# Patient Record
Sex: Female | Born: 1960 | Race: White | Hispanic: No | Marital: Married | Smoking: Never smoker
Health system: Southern US, Community
[De-identification: ages and names within clinical notes are randomized; demographics above are authoritative.]

## PROBLEM LIST (undated history)

## (undated) DIAGNOSIS — E0590 Thyrotoxicosis, unspecified without thyrotoxic crisis or storm: Secondary | ICD-10-CM

## (undated) DIAGNOSIS — M779 Enthesopathy, unspecified: Secondary | ICD-10-CM

## (undated) DIAGNOSIS — I10 Essential (primary) hypertension: Secondary | ICD-10-CM

## (undated) HISTORY — DX: Essential (primary) hypertension: I10

## (undated) HISTORY — DX: Thyrotoxicosis, unspecified without thyrotoxic crisis or storm: E05.90

## (undated) HISTORY — DX: Enthesopathy, unspecified: M77.9

---

## 1998-11-23 DIAGNOSIS — M779 Enthesopathy, unspecified: Secondary | ICD-10-CM

## 1998-11-23 HISTORY — DX: Enthesopathy, unspecified: M77.9

## 2010-10-28 LAB — HM COLONOSCOPY

## 2016-06-17 ENCOUNTER — Ambulatory Visit (INDEPENDENT_AMBULATORY_CARE_PROVIDER_SITE_OTHER): Payer: BLUE CROSS/BLUE SHIELD | Admitting: Physician Assistant

## 2016-06-17 ENCOUNTER — Encounter: Payer: Self-pay | Admitting: Physician Assistant

## 2016-06-17 VITALS — BP 120/92 | HR 95 | Ht 70.0 in | Wt 227.0 lb

## 2016-06-17 DIAGNOSIS — E0590 Thyrotoxicosis, unspecified without thyrotoxic crisis or storm: Secondary | ICD-10-CM | POA: Diagnosis not present

## 2016-06-17 DIAGNOSIS — E669 Obesity, unspecified: Secondary | ICD-10-CM

## 2016-06-17 DIAGNOSIS — I10 Essential (primary) hypertension: Secondary | ICD-10-CM | POA: Diagnosis not present

## 2016-06-17 NOTE — Progress Notes (Addendum)
   Subjective:    Patient ID: Kimberly Middleton, female    DOB: [birth year], 55 y.o.   MRN: [id number]  HPI Pt is a 55 year old female that presents to clinic to establish care. She has a few concerns. She has a history of hyperthyroidism and wants to discuss continued management. She also is concerned about weight gain following menopause. She states that she wants to work on losing weight and has started to make some lifestyle modifications. She reports not being interested in medical therapy for weight loss at this time.   She feels well controlled with lisinopril for hypertension. No other complaints.  .. Active Ambulatory Problems    Diagnosis Date Noted  . Essential hypertension, benign 06/17/2016  . Hyperthyroidism 06/17/2016  . Obese 06/17/2016   Resolved Ambulatory Problems    Diagnosis Date Noted  . No Resolved Ambulatory Problems   No Additional Past Medical History   .Marland Kitchen Family History  Problem Relation Age of Onset  . Heart attack Father   . Diabetes Mellitus I Brother    . Social History   Social History  . Marital status: Married    Spouse name: N/A  . Number of children: N/A  . Years of education: N/A   Occupational History  . Not on file.   Social History Main Topics  . Smoking status: Never Smoker  . Smokeless tobacco: Never Used  . Alcohol use Yes  . Drug use: No  . Sexual activity: Yes   Other Topics Concern  . Not on file   Social History Narrative  . No narrative on file       Review of Systems  All other systems reviewed and are negative.      Objective:   Physical Exam  Constitutional: She is oriented to person, place, and time. She appears well-developed and well-nourished.  HENT:  Head: Normocephalic and atraumatic.  Neck: Neck supple. No thyromegaly present.  Cardiovascular: Normal rate, regular rhythm and normal heart sounds.   Pulmonary/Chest: Effort normal and breath sounds normal.  Neurological: She is alert and  oriented to person, place, and time.  Psychiatric: She has a normal mood and affect. Her behavior is normal.       Assessment & Plan:  Hyperthyroidism- Followed by endocrinology. Discussed with pt to Consider thyroid ablation to better manage thyroid disease. I am willing to take over management but I think long term she needs ablation. Follow up in 3 months.   Obesity- Discussed healthy diet and exercise. Encourage drinking enough water, increasing activity and realistic expectations. Discussed counting calories to be about 1500 calories a day. Discussed weight loss medicine. Follow up as needed.   Hypertension- Continue lisinopril. Recheck BP improved.   Follow up in 3 months for fasting labs

## 2016-07-01 ENCOUNTER — Encounter: Payer: Self-pay | Admitting: Physician Assistant

## 2016-07-01 DIAGNOSIS — N052 Unspecified nephritic syndrome with diffuse membranous glomerulonephritis: Secondary | ICD-10-CM | POA: Insufficient documentation

## 2016-07-01 DIAGNOSIS — B029 Zoster without complications: Secondary | ICD-10-CM | POA: Insufficient documentation

## 2016-09-18 ENCOUNTER — Ambulatory Visit: Payer: BLUE CROSS/BLUE SHIELD | Admitting: Physician Assistant

## 2017-01-29 ENCOUNTER — Ambulatory Visit (INDEPENDENT_AMBULATORY_CARE_PROVIDER_SITE_OTHER): Payer: BLUE CROSS/BLUE SHIELD | Admitting: Physician Assistant

## 2017-01-29 ENCOUNTER — Encounter: Payer: Self-pay | Admitting: Physician Assistant

## 2017-01-29 VITALS — BP 150/76 | HR 116 | Temp 100.3°F | Ht 70.0 in | Wt 234.0 lb

## 2017-01-29 DIAGNOSIS — R059 Cough, unspecified: Secondary | ICD-10-CM

## 2017-01-29 DIAGNOSIS — R05 Cough: Secondary | ICD-10-CM

## 2017-01-29 DIAGNOSIS — R509 Fever, unspecified: Secondary | ICD-10-CM | POA: Diagnosis not present

## 2017-01-29 DIAGNOSIS — J101 Influenza due to other identified influenza virus with other respiratory manifestations: Secondary | ICD-10-CM | POA: Diagnosis not present

## 2017-01-29 LAB — POCT INFLUENZA A/B
INFLUENZA A, POC: POSITIVE — AB
INFLUENZA B, POC: NEGATIVE

## 2017-01-29 MED ORDER — OSELTAMIVIR PHOSPHATE 75 MG PO CAPS: 75.0000 mg | ORAL_CAPSULE | Freq: Two times a day (BID) | ORAL | 0 refills | Status: DC

## 2017-01-29 MED ORDER — OSELTAMIVIR PHOSPHATE 75 MG PO CAPS
75.0000 mg | ORAL_CAPSULE | Freq: Two times a day (BID) | ORAL | 0 refills | Status: DC
Start: 2017-01-29 — End: 2017-01-29

## 2017-01-29 NOTE — Progress Notes (Addendum)
   Subjective:    Patient ID: Kimberly Middleton, female    DOB: [birth year], 58 y.o.   MRN: [id number]  HPI Pt is a 56 yo female who presents to the clinic with 1 week of cold like symptoms that last night she spiked a fever of 101 and symptoms worsened. She feels weak and has a productive cough. She reports fever and chills. Her boss at work was diagnosed with the flu. No ear pain, ST, sinus pressure. Pt did not have flu shot. She is taking OTC mucinex and robatussin DM. It is helping some.    Review of Systems  All other systems reviewed and are negative.      Objective:   Physical Exam  Constitutional: She is oriented to person, place, and time. She appears well-developed and well-nourished.  HENT:  Head: Normocephalic and atraumatic.  Right Ear: External ear normal.  Left Ear: External ear normal.  Nose: Nose normal.  Mouth/Throat: Oropharynx is clear and moist. No oropharyngeal exudate.  TM's erythematous bilaterally. Good light reflex.  Negative for any sinus tenderness to palpation.   Eyes: Conjunctivae are normal. Right eye exhibits no discharge. Left eye exhibits no discharge.  Neck: Normal range of motion. Neck supple.  Cardiovascular: Normal rate, regular rhythm and normal heart sounds.   Pulmonary/Chest: Effort normal and breath sounds normal. She has no wheezes.  Lymphadenopathy:    She has cervical adenopathy.  Neurological: She is alert and oriented to person, place, and time.  Psychiatric: She has a normal mood and affect. Her behavior is normal.          Assessment & Plan:  Marland Kitchen.Marland Kitchen.Misty StanleyLisa was seen today for cough, headache and fever.  Diagnoses and all orders for this visit:  Influenza A -     oseltamivir (TAMIFLU) 75 MG capsule; Take 1 capsule (75 mg total) by mouth 2 (two) times daily. For 5 days.  Cough -     POCT Influenza A/B  Fever, unspecified fever cause -     POCT Influenza A/B  Other orders -     Discontinue: oseltamivir (TAMIFLU) 75 MG capsule;  Take 1 capsule (75 mg total) by mouth 2 (two) times daily. For 5 days.   Sent tamiflu. Discussed symptomatic care. Encouraged rest, hydration. Continue using cough syrup OTC. Written out of work for today. Go back on Monday.

## 2017-01-29 NOTE — Patient Instructions (Signed)

## 2017-06-17 ENCOUNTER — Encounter (INDEPENDENT_AMBULATORY_CARE_PROVIDER_SITE_OTHER): Payer: Self-pay | Admitting: Family Medicine

## 2017-10-18 LAB — HM MAMMOGRAPHY

## 2017-11-13 LAB — HM PAP SMEAR: HM Pap smear: NEGATIVE

## 2017-12-03 ENCOUNTER — Encounter: Payer: Self-pay | Admitting: Physician Assistant

## 2018-01-12 ENCOUNTER — Encounter: Payer: Self-pay | Admitting: Physician Assistant

## 2018-03-22 ENCOUNTER — Ambulatory Visit (INDEPENDENT_AMBULATORY_CARE_PROVIDER_SITE_OTHER): Payer: BLUE CROSS/BLUE SHIELD | Admitting: Physician Assistant

## 2018-03-22 ENCOUNTER — Encounter: Payer: Self-pay | Admitting: Physician Assistant

## 2018-03-22 VITALS — BP 122/81 | HR 84 | Ht 70.0 in | Wt 222.0 lb

## 2018-03-22 DIAGNOSIS — H6983 Other specified disorders of Eustachian tube, bilateral: Secondary | ICD-10-CM

## 2018-03-22 MED ORDER — METHYLPREDNISOLONE 4 MG PO TBPK: ORAL_TABLET | ORAL | 0 refills | Status: DC

## 2018-03-22 NOTE — Patient Instructions (Signed)
Eustachian Tube Dysfunction The eustachian tube connects the middle ear to the back of the nose. It regulates air pressure in the middle ear by allowing air to move between the ear and nose. It also helps to drain fluid from the middle ear space. When the eustachian tube does not function properly, air pressure, fluid, or both can build up in the middle ear. Eustachian tube dysfunction can affect one or both ears. What are the causes? This condition happens when the eustachian tube becomes blocked or cannot open normally. This may result from:  Ear infections.  Colds and other upper respiratory infections.  Allergies.  Irritation, such as from cigarette smoke or acid from the stomach coming up into the esophagus (gastroesophageal reflux).  Sudden changes in air pressure, such as from descending in an airplane.  Abnormal growths in the nose or throat, such as nasal polyps, tumors, or enlarged tissue at the back of the throat (adenoids).  What increases the risk? This condition may be more likely to develop in people who smoke and people who are overweight. Eustachian tube dysfunction may also be more likely to develop in children, especially children who have:  Certain birth defects of the mouth, such as cleft palate.  Large tonsils and adenoids.  What are the signs or symptoms? Symptoms of this condition may include:  A feeling of fullness in the ear.  Ear pain.  Clicking or popping noises in the ear.  Ringing in the ear.  Hearing loss.  Loss of balance.  Symptoms may get worse when the air pressure around you changes, such as when you travel to an area of high elevation or fly on an airplane. How is this diagnosed? This condition may be diagnosed based on:  Your symptoms.  A physical exam of your ear, nose, and throat.  Tests, such as those that measure: ? The movement of your eardrum (tympanogram). ? Your hearing (audiometry).  How is this treated? Treatment  depends on the cause and severity of your condition. If your symptoms are mild, you may be able to relieve your symptoms by moving air into ("popping") your ears. If you have symptoms of fluid in your ears, treatment may include:  Decongestants.  Antihistamines.  Nasal sprays or ear drops that contain medicines that reduce swelling (steroids).  In some cases, you may need to have a procedure to drain the fluid in your eardrum (myringotomy). In this procedure, a small tube is placed in the eardrum to:  Drain the fluid.  Restore the air in the middle ear space.  Follow these instructions at home:  Take over-the-counter and prescription medicines only as told by your health care provider.  Use techniques to help pop your ears as recommended by your health care provider. These may include: ? Chewing gum. ? Yawning. ? Frequent, forceful swallowing. ? Closing your mouth, holding your nose closed, and gently blowing as if you are trying to blow air out of your nose.  Do not do any of the following until your health care provider approves: ? Travel to high altitudes. ? Fly in airplanes. ? Work in a pressurized cabin or room. ? Scuba dive.  Keep your ears dry. Dry your ears completely after showering or bathing.  Do not smoke.  Keep all follow-up visits as told by your health care provider. This is important. Contact a health care provider if:  Your symptoms do not go away after treatment.  Your symptoms come back after treatment.  You are   unable to pop your ears.  You have: ? A fever. ? Pain in your ear. ? Pain in your head or neck. ? Fluid draining from your ear.  Your hearing suddenly changes.  You become very dizzy.  You lose your balance. This information is not intended to replace advice given to you by your health care provider. Make sure you discuss any questions you have with your health care provider. Document Released: 12/06/2015 Document Revised: 04/16/2016  Document Reviewed: 11/28/2014 [employer]vier Interactive Patient Education  2018 [employer]  

## 2018-03-22 NOTE — Progress Notes (Signed)
   Subjective:    Patient ID: Kimberly Middleton, female    DOB: 1961-07-02, 57 y.o.   MRN: [id number]  HPI  Pt is a 57 yo female with HTN, hyperthyroidism who presents to the clinic with headache and bilateral ear pain intermittently for a few weeks. Her headache was really bad this weekend but has since improved. She denies any sinus pressure, cough, SOB, wheezing, ST. She denies any allergy symptoms with watery itchy eyes. Last night her ears were hurting really bad. No fever, chills, body aches.    Hyperthyroidism-managed by endocrinology.   .. Active Ambulatory Problems    Diagnosis Date Noted  . Essential hypertension, benign 06/17/2016  . Hyperthyroidism 06/17/2016  . Obese 06/17/2016  . 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 Herpes zoster 07/01/2016   Resolved Ambulatory Problems    Diagnosis Date Noted  . No Resolved Ambulatory Problems   Past Medical History:  Diagnosis Date  . Bone spur 2000  . Hypertension   . Hyperthyroidism      Review of Systems  All other systems reviewed and are negative.      Objective:   Physical Exam  Constitutional: She is oriented to person, place, and time. She appears well-developed and well-nourished.  HENT:  Head: Normocephalic and atraumatic.  Right Ear: External ear normal.  Left Ear: External ear normal.  TM"s slightly retracted. Good light reflex.  Negative for sinus tenderness.  Eyes: Pupils are equal, round, and reactive to light. Conjunctivae and EOM are normal.  Neck:  Right thyroid goiter.   Cardiovascular: Normal rate and regular rhythm.  Pulmonary/Chest: Effort normal and breath sounds normal.  Neurological: She is alert and oriented to person, place, and time.  Skin: No rash noted.  Psychiatric: She has a normal mood and affect. Her behavior is normal.          Assessment & Plan:  Marland KitchenMarland KitchenDiagnoses and all orders for this visit:  ETD (Eustachian tube dysfunction), bilateral -     methylPREDNISolone  (MEDROL DOSEPAK) 4 MG TBPK tablet; Take as directed by package insert.   Reassurance given that I see no signs of infection today. 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 Discussed chewing gum when doing this. Encouraged flonase. Medrol dose pak given today.

## 2018-08-09 ENCOUNTER — Ambulatory Visit (INDEPENDENT_AMBULATORY_CARE_PROVIDER_SITE_OTHER): Payer: BLUE CROSS/BLUE SHIELD

## 2018-08-09 ENCOUNTER — Encounter: Payer: Self-pay | Admitting: Physician Assistant

## 2018-08-09 ENCOUNTER — Ambulatory Visit (INDEPENDENT_AMBULATORY_CARE_PROVIDER_SITE_OTHER): Payer: BLUE CROSS/BLUE SHIELD | Admitting: Physician Assistant

## 2018-08-09 VITALS — BP 130/78 | HR 99 | Ht 70.0 in | Wt 222.0 lb

## 2018-08-09 DIAGNOSIS — M25552 Pain in left hip: Secondary | ICD-10-CM

## 2018-08-09 DIAGNOSIS — Z131 Encounter for screening for diabetes mellitus: Secondary | ICD-10-CM

## 2018-08-09 DIAGNOSIS — M1612 Unilateral primary osteoarthritis, left hip: Secondary | ICD-10-CM | POA: Diagnosis not present

## 2018-08-09 DIAGNOSIS — Z23 Encounter for immunization: Secondary | ICD-10-CM | POA: Diagnosis not present

## 2018-08-09 DIAGNOSIS — Z0000 Encounter for general adult medical examination without abnormal findings: Secondary | ICD-10-CM | POA: Diagnosis not present

## 2018-08-09 DIAGNOSIS — Z13220 Encounter for screening for lipoid disorders: Secondary | ICD-10-CM

## 2018-08-09 NOTE — Patient Instructions (Addendum)
Health Maintenance for Postmenopausal Women Menopause is a normal process in which your reproductive ability comes to an end. This process happens gradually over a span of months to years, usually between the ages of 22 and 9. Menopause is complete when you have missed 12 consecutive menstrual periods. It is important to talk with your health care provider about some of the most common conditions that affect postmenopausal women, such as heart disease, cancer, and bone loss (osteoporosis). Adopting a healthy lifestyle and getting preventive care can help to promote your health and wellness. Those actions can also lower your chances of developing some of these common conditions. What should I know about menopause? During menopause, you may experience a number of symptoms, such as:  Moderate-to-severe hot flashes.  Night sweats.  Decrease in sex drive.  Mood swings.  Headaches.  Tiredness.  Irritability.  Memory problems.  Insomnia.  Choosing to treat or not to treat menopausal changes is an individual decision that you make with your health care provider. What should I know about hormone replacement therapy and supplements? Hormone therapy products are effective for treating symptoms that are associated with menopause, such as hot flashes and night sweats. Hormone replacement carries certain risks, especially as you become older. If you are thinking about using estrogen or estrogen with progestin treatments, discuss the benefits and risks with your health care provider. What should I know about heart disease and stroke? Heart disease, heart attack, and stroke become more likely as you age. This may be due, in part, to the hormonal changes that your body experiences during menopause. These can affect how your body processes dietary fats, triglycerides, and cholesterol. Heart attack and stroke are both medical emergencies. There are many things that you can do to help prevent heart disease  and stroke:  Have your blood pressure checked at least every 1-2 years. High blood pressure causes heart disease and increases the risk of stroke.  If you are 53-22 years old, ask your health care provider if you should take aspirin to prevent a heart attack or a stroke.  Do not use any tobacco products, including cigarettes, chewing tobacco, or electronic cigarettes. If you need help quitting, ask your health care provider.  It is important to eat a healthy diet and maintain a healthy weight. ? Be sure to include plenty of vegetables, fruits, low-fat dairy products, and lean protein. ? Avoid eating foods that are high in solid fats, added sugars, or salt (sodium).  Get regular exercise. This is one of the most important things that you can do for your health. ? Try to exercise for at least 150 minutes each week. The type of exercise that you do should increase your heart rate and make you sweat. This is known as moderate-intensity exercise. ? Try to do strengthening exercises at least twice each week. Do these in addition to the moderate-intensity exercise.  Know your numbers.Ask your health care provider to check your cholesterol and your blood glucose. Continue to have your blood tested as directed by your health care provider.  What should I know about cancer screening? There are several types of cancer. Take the following steps to reduce your risk and to catch any cancer development as early as possible. Breast Cancer  Practice breast self-awareness. ? This means understanding how your breasts normally appear and feel. ? It also means doing regular breast self-exams. Let your health care provider know about any changes, no matter how small.  If you are 40  or older, have a clinician do a breast exam (clinical breast exam or CBE) every year. Depending on your age, family history, and medical history, it may be recommended that you also have a yearly breast X-ray (mammogram).  If you  have a family history of breast cancer, talk with your health care provider about genetic screening.  If you are at high risk for breast cancer, talk with your health care provider about having an MRI and a mammogram every year.  Breast cancer (BRCA) gene test is recommended for women who have family members with BRCA-related cancers. Results of the assessment will determine the need for genetic counseling and BRCA1 and for BRCA2 testing. BRCA-related cancers include these types: ? Breast. This occurs in males or females. ? Ovarian. ? Tubal. This may also be called fallopian tube cancer. ? Cancer of the abdominal or pelvic lining (peritoneal cancer). ? Prostate. ? Pancreatic.  Cervical, Uterine, and Ovarian Cancer Your health care provider may recommend that you be screened regularly for cancer of the pelvic organs. These include your ovaries, uterus, and vagina. This screening involves a pelvic exam, which includes checking for microscopic changes to the surface of your cervix (Pap test).  For women ages 21-65, health care providers may recommend a pelvic exam and a Pap test every three years. For women ages 79-65, they may recommend the Pap test and pelvic exam, combined with testing for human papilloma virus (HPV), every five years. Some types of HPV increase your risk of cervical cancer. Testing for HPV may also be done on women of any age who have unclear Pap test results.  Other health care providers may not recommend any screening for nonpregnant women who are considered low risk for pelvic cancer and have no symptoms. Ask your health care provider if a screening pelvic exam is right for you.  If you have had past treatment for cervical cancer or a condition that could lead to cancer, you need Pap tests and screening for cancer for at least 20 years after your treatment. If Pap tests have been discontinued for you, your risk factors (such as having a new sexual partner) need to be  reassessed to determine if you should start having screenings again. Some women have medical problems that increase the chance of getting cervical cancer. In these cases, your health care provider may recommend that you have screening and Pap tests more often.  If you have a family history of uterine cancer or ovarian cancer, talk with your health care provider about genetic screening.  If you have vaginal bleeding after reaching menopause, tell your health care provider.  There are currently no reliable tests available to screen for ovarian cancer.  Lung Cancer Lung cancer screening is recommended for adults 69-62 years old who are at high risk for lung cancer because of a history of smoking. A yearly low-dose CT scan of the lungs is recommended if you:  Currently smoke.  Have a history of at least 30 pack-years of smoking and you currently smoke or have quit within the past 15 years. A pack-year is smoking an average of one pack of cigarettes per day for one year.  Yearly screening should:  Continue until it has been 15 years since you quit.  Stop if you develop a health problem that would prevent you from having lung cancer treatment.  Colorectal Cancer  This type of cancer can be detected and can often be prevented.  Routine colorectal cancer screening usually begins at  age 42 and continues through age 45.  If you have risk factors for colon cancer, your health care provider may recommend that you be screened at an earlier age.  If you have a family history of colorectal cancer, talk with your health care provider about genetic screening.  Your health care provider may also recommend using home test kits to check for hidden blood in your stool.  A small camera at the end of a tube can be used to examine your colon directly (sigmoidoscopy or colonoscopy). This is done to check for the earliest forms of colorectal cancer.  Direct examination of the colon should be repeated every  5-10 years until age 71. However, if early forms of precancerous polyps or small growths are found or if you have a family history or genetic risk for colorectal cancer, you may need to be screened more often.  Skin Cancer  Check your skin from head to toe regularly.  Monitor any moles. Be sure to tell your health care provider: ? About any new moles or changes in moles, especially if there is a change in a mole's shape or color. ? If you have a mole that is larger than the size of a pencil eraser.  If any of your family members has a history of skin cancer, especially at a young age, talk with your health care provider about genetic screening.  Always use sunscreen. Apply sunscreen liberally and repeatedly throughout the day.  Whenever you are outside, protect yourself by wearing long sleeves, pants, a wide-brimmed hat, and sunglasses.  What should I know about osteoporosis? Osteoporosis is a condition in which bone destruction happens more quickly than new bone creation. After menopause, you may be at an increased risk for osteoporosis. To help prevent osteoporosis or the bone fractures that can happen because of osteoporosis, the following is recommended:  If you are 46-71 years old, get at least 1,000 mg of calcium and at least 600 mg of vitamin D per day.  If you are older than age 55 but younger than age 65, get at least 1,200 mg of calcium and at least 600 mg of vitamin D per day.  If you are older than age 54, get at least 1,200 mg of calcium and at least 800 mg of vitamin D per day.  Smoking and excessive alcohol intake increase the risk of osteoporosis. Eat foods that are rich in calcium and vitamin D, and do weight-bearing exercises several times each week as directed by your health care provider. What should I know about how menopause affects my mental health? Depression may occur at any age, but it is more common as you become older. Common symptoms of depression  include:  Low or sad mood.  Changes in sleep patterns.  Changes in appetite or eating patterns.  Feeling an overall lack of motivation or enjoyment of activities that you previously enjoyed.  Frequent crying spells.  Talk with your health care provider if you think that you are experiencing depression. What should I know about immunizations? It is important that you get and maintain your immunizations. These include:  Tetanus, diphtheria, and pertussis (Tdap) booster vaccine.  Influenza every year before the flu season begins.  Pneumonia vaccine.  Shingles vaccine.  Your health care provider may also recommend other immunizations. This information is not intended to replace advice given to you by your health care provider. Make sure you discuss any questions you have with your health care provider. Document Released: 01/01/2006  Document Revised: 05/29/2016 Document Reviewed: 08/13/2015 Elsevier Interactive Patient Education  2018 [place] chondroitin/ibuprofen '800mg'$  as needed/fish oil '4000mg'$  daily/exercises and massage therapy.   Massage envy- kyle, amber, missy Osteoarthritis Osteoarthritis is a type of arthritis that affects tissue that covers the ends of bones in joints (cartilage). Cartilage acts as a cushion between the bones and helps them move smoothly. Osteoarthritis results when cartilage in the joints gets worn down. Osteoarthritis is sometimes called "wear and tear" arthritis. Osteoarthritis is the most common form of arthritis. It often occurs in older people. It is a condition that gets worse over time (a progressive condition). Joints that are most often affected by this condition are in:  Fingers.  Toes.  Hips.  Knees.  Spine, including neck and lower back.  What are the causes? This condition is caused by age-related wearing down of cartilage that covers the ends of bones. What increases the risk? The following factors may make  you more likely to develop this condition:  Older age.  Being overweight or obese.  Overuse of joints, such as in athletes.  Past injury of a joint.  Past surgery on a joint.  Family history of osteoarthritis.  What are the signs or symptoms? The main symptoms of this condition are pain, swelling, and stiffness in the joint. The joint may lose its shape over time. Small pieces of bone or cartilage may break off and float inside of the joint, which may cause more pain and damage to the joint. Small deposits of bone (osteophytes) may grow on the edges of the joint. Other symptoms may include:  A grating or scraping feeling inside the joint when you move it.  Popping or creaking sounds when you move.  Symptoms may affect one or more joints. Osteoarthritis in a major joint, such as your knee or hip, can make it painful to walk or exercise. If you have osteoarthritis in your hands, you might not be able to grip items, twist your hand, or control small movements of your hands and fingers (fine motor skills). How is this diagnosed? This condition may be diagnosed based on:  Your medical history.  A physical exam.  Your symptoms.  X-rays of the affected joint(s).  Blood tests to rule out other types of arthritis.  How is this treated? There is no cure for this condition, but treatment can help to control pain and improve joint function. Treatment plans may include:  A prescribed exercise program that allows for rest and joint relief. You may work with a physical therapist.  A weight control plan.  Pain relief techniques, such as: ? Applying heat and cold to the joint. ? Electric pulses delivered to nerve endings under the skin (transcutaneous electrical nerve stimulation, or TENS). ? Massage. ? Certain nutritional supplements.  NSAIDs or prescription medicines to help relieve pain.  Medicine to help relieve pain and inflammation (corticosteroids). This can be given by mouth  (orally) or as an injection.  Assistive devices, such as a brace, wrap, splint, specialized glove, or cane.  Surgery, such as: ? An osteotomy. This is done to reposition the bones and relieve pain or to remove loose pieces of bone and cartilage. ? Joint replacement surgery. You may need this surgery if you have very bad (advanced) osteoarthritis.  Follow these instructions at home: Activity  Rest your affected joints as directed by your health care provider.  Do not drive or use heavy machinery while taking prescription pain medicine.  Exercise as directed.  Your health care provider or physical therapist may recommend specific types of exercise, such as: ? Strengthening exercises. These are done to strengthen the muscles that support joints that are affected by arthritis. They can be performed with weights or with exercise bands to add resistance. ? Aerobic activities. These are exercises, such as brisk walking or water aerobics, that get your heart pumping. ? Range-of-motion activities. These keep your joints easy to move. ? Balance and agility exercises. Managing pain, stiffness, and swelling  If directed, apply heat to the affected area as often as told by your health care provider. Use the heat source that your health care provider recommends, such as a moist heat pack or a heating pad. ? If you have a removable assistive device, remove it as told by your health care provider. ? Place a towel between your skin and the heat source. If your health care provider tells you to keep the assistive device on while you apply heat, place a towel between the assistive device and the heat source. ? Leave the heat on for 20-30 minutes. ? Remove the heat if your skin turns bright red. This is especially important if you are unable to feel pain, heat, or cold. You may have a greater risk of getting burned.  If directed, put ice on the affected joint: ? If you have a removable assistive device,  remove it as told by your health care provider. ? Put ice in a plastic bag. ? Place a towel between your skin and the bag. If your health care provider tells you to keep the assistive device on during icing, place a towel between the assistive device and the bag. ? Leave the ice on for 20 minutes, 2-3 times a day. General instructions  Take over-the-counter and prescription medicines only as told by your health care provider.  Maintain a healthy weight. Follow instructions from your health care provider for weight control. These may include dietary restrictions.  Do not use any products that contain nicotine or tobacco, such as cigarettes and e-cigarettes. These can delay bone healing. If you need help quitting, ask your health care provider.  Use assistive devices as directed by your health care provider.  Keep all follow-up visits as told by your health care provider. This is important. Where to find more information:  [employer] of Arthritis and Musculoskeletal and Skin Diseases: www.niams.SouthExposed.es  [employer] on Aging: http://kim-miller.com/  American College of Rheumatology: www.rheumatology.org Contact a health care provider if:  Your skin turns red.  You develop a rash.  You have pain that gets worse.  You have a fever along with joint or muscle aches. Get help right away if:  You lose a lot of weight.  You suddenly lose your appetite.  You have night sweats. Summary  Osteoarthritis is a type of arthritis that affects tissue covering the ends of bones in joints (cartilage).  This condition is caused by age-related wearing down of cartilage that covers the ends of bones.  The main symptom of this condition is pain, swelling, and stiffness in the joint.  There is no cure for this condition, but treatment can help to control pain and improve joint function. This information is not intended to replace advice given to you by your health care provider. Make  sure you discuss any questions you have with your health care provider. Document Released: 11/09/2005 Document Revised: 07/13/2016 Document Reviewed: 07/13/2016 Elsevier Interactive Patient Education  [employer].

## 2018-08-09 NOTE — Progress Notes (Signed)
Subjective:     Kimberly Middleton is a 57 y.o. female and is here for a comprehensive physical exam. The patient reports problems - she overall feels more stiffness in back and hips. she is having some left hip pain that flares up. at times hard to walk up steps or even get up from a chair. she has not done anything for it. she denies any fatigue but feels weak and stiff. .  Social History   Socioeconomic History  . Marital status: Married    Spouse name: Not on file  . Number of children: Not on file  . Years of education: Not on file  . Highest education level: Not on file  Occupational History  . Not on file  Social Needs  . Financial resource strain: Not on file  . Food insecurity:    Worry: Not on file    Inability: Not on file  . Transportation needs:    Medical: Not on file    Non-medical: Not on file  Tobacco Use  . Smoking status: Never Smoker  . Smokeless tobacco: Never Used  Substance and Sexual Activity  . Alcohol use: Yes  . Drug use: No  . Sexual activity: Yes  Lifestyle  . Physical activity:    Days per week: Not on file    Minutes per session: Not on file  . Stress: Not on file  Relationships  . Social connections:    Talks on phone: Not on file    Gets together: Not on file    Attends religious service: Not on file    Active member of club or organization: Not on file    Attends meetings of clubs or organizations: Not on file    Relationship status: Not on file  . Intimate partner violence:    Fear of current or ex partner: Not on file    Emotionally abused: Not on file    Physically abused: Not on file    Forced sexual activity: Not on file  Other Topics Concern  . Not on file  Social History Narrative  . Not on file   Health Maintenance  Topic Date Due  . Hepatitis C Screening  [birth year]  . HIV Screening  07/18/1974  . TETANUS/TDAP  07/18/1978  . COLONOSCOPY  07/18/2009  . INFLUENZA VACCINE  09/11/2018 (Originally 06/23/2018)  . MAMMOGRAM   10/19/2019  . PAP SMEAR  11/13/2020    The following portions of the patient's history were reviewed and updated as appropriate: allergies, current medications, past family history, past medical history, past social history, past surgical history and problem list.  Review of Systems Pertinent items noted in HPI and remainder of comprehensive ROS otherwise negative.   Objective:    BP 130/78   Pulse 99   Ht 5\' 10"  (1.778 m)   Wt 222 lb (100.7 kg)   BMI 31.85 kg/m  General appearance: alert, cooperative and appears stated age Head: Normocephalic, without obvious abnormality, atraumatic Eyes: conjunctivae/corneas clear. PERRL, EOM's intact. Fundi benign. Ears: normal TM's and external ear canals both ears Nose: Nares normal. Septum midline. Mucosa normal. No drainage or sinus tenderness. Throat: lips, mucosa, and tongue normal; teeth and gums normal Neck: no adenopathy, no carotid bruit, no JVD, supple, symmetrical, trachea midline and thyroid not enlarged, symmetric, no tenderness/mass/nodules Back: symmetric, no curvature. ROM normal. No CVA tenderness. Lungs: clear to auscultation bilaterally Heart: regular rate and rhythm, S1, S2 normal, no murmur, click, rub or gallop Abdomen: soft, non-tender;  bowel sounds normal; no masses,  no organomegaly Extremities: extremities normal, atraumatic, no cyanosis or edema NROM of both hips. No tenderness over greater trochanters. Negative straight leg raise.  Pulses: 2+ and symmetric Skin: Skin color, texture, turgor normal. No rashes or lesions Lymph nodes: Cervical, supraclavicular, and axillary nodes normal. Neurologic: Alert and oriented X 3, normal strength and tone. Normal symmetric reflexes. Normal coordination and gait    Assessment:    Healthy female exam.      Plan:    Marland KitchenMarland KitchenAdaley was seen today for annual exam.  Diagnoses and all orders for this visit:  Routine physical examination -     Hepatitis C Antibody -     Lipid Panel  w/reflex Direct LDL -     COMPLETE METABOLIC PANEL WITH GFR -     CBC with Differential/Platelet -     Cancel: DG HIP UNILAT WITH PELVIS 1V LEFT -     DG HIP UNILAT WITH PELVIS 2-3 VIEWS LEFT  Screening for lipid disorders -     Lipid Panel w/reflex Direct LDL  Screening for diabetes mellitus -     COMPLETE METABOLIC PANEL WITH GFR  Left hip pain -     Cancel: DG HIP UNILAT WITH PELVIS 1V LEFT -     DG HIP UNILAT WITH PELVIS 2-3 VIEWS LEFT  Need for Tdap vaccination -     Tdap vaccine greater than or equal to 7yo IM   .Marland Kitchen Depression screen [facility] 2/9 08/09/2018  Decreased Interest 0  Down, Depressed, Hopeless 0  PHQ - 2 Score 0  Altered sleeping 0  Tired, decreased energy 0  Change in appetite 0  Feeling bad or failure about yourself  0  Trouble concentrating 0  Moving slowly or fidgety/restless 0  Suicidal thoughts 0  PHQ-9 Score 0  Difficult doing work/chores Not difficult at all   .Marland Kitchen Discussed 150 minutes of exercise a week.  Encouraged vitamin D 1000 units and Calcium 1300mg  or 4 servings of dairy a day.  Pt had colonoscopy. Calling for report.  Mammogram up to date.  Pap smear up to date.  Declined flu and shingles.  Fasting labs ordered.   Xray ordered for left hip pain. Sounds like early OA. Discussed NSAIDs, tumeric, exercises etc to help with progression. Encouraged massage for low back stiffiness. Follow up as needed.   See After Visit Summary for Counseling Recommendations

## 2018-08-10 ENCOUNTER — Encounter: Payer: Self-pay | Admitting: Physician Assistant

## 2018-08-10 DIAGNOSIS — M5136 Other intervertebral disc degeneration, lumbar region: Secondary | ICD-10-CM | POA: Insufficient documentation

## 2018-08-10 DIAGNOSIS — M1612 Unilateral primary osteoarthritis, left hip: Secondary | ICD-10-CM | POA: Insufficient documentation

## 2018-08-10 NOTE — Progress Notes (Signed)
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

## 2018-08-10 NOTE — Telephone Encounter (Signed)
Can we contact Dr. Noe GensPeters office for colonoscopy report to abstract in.

## 2018-08-13 LAB — CBC WITH DIFFERENTIAL/PLATELET
BASOS ABS: 7 {cells}/uL (ref 0–200)
BASOS PCT: 0.1 %
EOS ABS: 49 {cells}/uL (ref 15–500)
Eosinophils Relative: 0.7 %
HEMATOCRIT: 34.5 % — AB (ref 35.0–45.0)
Hemoglobin: 11.9 g/dL (ref 11.7–15.5)
Lymphs Abs: 1050 cells/uL (ref 850–3900)
MCH: 30.9 pg (ref 27.0–33.0)
MCHC: 34.5 g/dL (ref 32.0–36.0)
MCV: 89.6 fL (ref 80.0–100.0)
MPV: 11 fL (ref 7.5–12.5)
Monocytes Relative: 7.1 %
NEUTROS ABS: 5397 {cells}/uL (ref 1500–7800)
Neutrophils Relative %: 77.1 %
Platelets: 189 10*3/uL (ref 140–400)
RBC: 3.85 10*6/uL (ref 3.80–5.10)
RDW: 12.9 % (ref 11.0–15.0)
Total Lymphocyte: 15 %
WBC: 7 10*3/uL (ref 3.8–10.8)
WBCMIX: 497 {cells}/uL (ref 200–950)

## 2018-08-13 LAB — LIPID PANEL W/REFLEX DIRECT LDL
CHOL/HDL RATIO: 3.3 (calc) (ref <5.0)
CHOLESTEROL: 247 mg/dL — AB (ref <200)
HDL: 74 mg/dL (ref >50)
LDL Cholesterol (Calc): 155 mg/dL (calc) — ABNORMAL HIGH
Non-HDL Cholesterol (Calc): 173 mg/dL (calc) — ABNORMAL HIGH (ref <130)
Triglycerides: 75 mg/dL (ref <150)

## 2018-08-13 LAB — COMPLETE METABOLIC PANEL WITH GFR
AG RATIO: 2 (calc) (ref 1.0–2.5)
ALT: 10 U/L (ref 6–29)
AST: 14 U/L (ref 10–35)
Albumin: 4.3 g/dL (ref 3.6–5.1)
Alkaline phosphatase (APISO): 106 U/L (ref 33–130)
BUN: 17 mg/dL (ref 7–25)
CALCIUM: 9.8 mg/dL (ref 8.6–10.4)
CO2: 28 mmol/L (ref 20–32)
CREATININE: 0.83 mg/dL (ref 0.50–1.05)
Chloride: 105 mmol/L (ref 98–110)
GFR, EST NON AFRICAN AMERICAN: 77 mL/min/{1.73_m2} (ref >=60)
GFR, Est African American: 89 mL/min/{1.73_m2} (ref >=60)
GLOBULIN: 2.1 g/dL (ref 1.9–3.7)
Glucose, Bld: 99 mg/dL (ref 65–99)
Potassium: 4.5 mmol/L (ref 3.5–5.3)
Sodium: 139 mmol/L (ref 135–146)
Total Bilirubin: 0.6 mg/dL (ref 0.2–1.2)
Total Protein: 6.4 g/dL (ref 6.1–8.1)

## 2018-08-13 LAB — HEPATITIS C ANTIBODY
HEP C AB: NONREACTIVE
SIGNAL TO CUT-OFF: 0.01 (ref <1.00)

## 2018-08-15 ENCOUNTER — Encounter: Payer: Self-pay | Admitting: Physician Assistant

## 2018-08-15 DIAGNOSIS — E7800 Pure hypercholesterolemia, unspecified: Secondary | ICD-10-CM | POA: Insufficient documentation

## 2018-08-15 NOTE — Progress Notes (Signed)
Call pt: Hep C normal. Kidney, liver, glucose look great.  HDL great. LDL elevated. TG great. Overall cardiac risk is 3.9 percent. Under 7.5 percent where medication is suggested. For now just keep up with healthy diet and staying active.

## 2018-08-26 ENCOUNTER — Encounter: Payer: Self-pay | Admitting: Physician Assistant

## 2018-11-28 ENCOUNTER — Encounter: Payer: Self-pay | Admitting: Physician Assistant

## 2018-11-29 ENCOUNTER — Ambulatory Visit (INDEPENDENT_AMBULATORY_CARE_PROVIDER_SITE_OTHER): Payer: BLUE CROSS/BLUE SHIELD | Admitting: Physician Assistant

## 2018-11-29 ENCOUNTER — Encounter: Payer: Self-pay | Admitting: Physician Assistant

## 2018-11-29 VITALS — BP 117/79 | HR 93 | Temp 98.4°F | Ht 70.0 in | Wt 232.0 lb

## 2018-11-29 DIAGNOSIS — R21 Rash and other nonspecific skin eruption: Secondary | ICD-10-CM

## 2018-11-29 MED ORDER — TRIAMCINOLONE ACETONIDE 0.1 % EX CREA: 1.0000 "application " | TOPICAL_CREAM | Freq: Two times a day (BID) | CUTANEOUS | 0 refills | Status: DC

## 2018-11-29 NOTE — Patient Instructions (Signed)
Contact Dermatitis  Dermatitis is redness, soreness, and swelling (inflammation) of the skin. Contact dermatitis is a reaction to something that touches the skin.  There are two types of contact dermatitis:   Irritant contact dermatitis. This happens when something bothers (irritates) your skin, like soap.   Allergic contact dermatitis. This is caused when you are exposed to something that you are allergic to, such as poison ivy.  What are the causes?   Common causes of irritant contact dermatitis include:  ? Makeup.  ? Soaps.  ? Detergents.  ? Bleaches.  ? Acids.  ? Metals, such as nickel.   Common causes of allergic contact dermatitis include:  ? Plants.  ? Chemicals.  ? Jewelry.  ? Latex.  ? Medicines.  ? Preservatives in products, such as clothing.  What increases the risk?   Having a job that exposes you to things that bother your skin.   Having asthma or eczema.  What are the signs or symptoms?  Symptoms may happen anywhere the irritant has touched your skin. Symptoms include:   Dry or flaky skin.   Redness.   Cracks.   Itching.   Pain or a burning feeling.   Blisters.   Blood or clear fluid draining from skin cracks.  With allergic contact dermatitis, swelling may occur. This may happen in places such as the eyelids, mouth, or genitals.  How is this treated?   This condition is treated by checking for the cause of the reaction and protecting your skin. Treatment may also include:  ? Steroid creams, ointments, or medicines.  ? Antibiotic medicines or other ointments, if you have a skin infection.  ? Lotion or medicines to help with itching.  ? A bandage (dressing).  Follow these instructions at home:  Skin care   Moisturize your skin as needed.   Put cool cloths on your skin.   Put a baking soda paste on your skin. Stir water into baking soda until it looks like a paste.   Do not scratch your skin.   Avoid having things rub up against your skin.   Avoid the use of soaps, perfumes, and  dyes.  Medicines   Take or apply over-the-counter and prescription medicines only as told by your doctor.   If you were prescribed an antibiotic medicine, take or apply it as told by your doctor. Do not stop using it even if your condition starts to get better.  Bathing   Take a bath with:  ? Epsom salts.  ? Baking soda.  ? Colloidal oatmeal.   Bathe less often.   Bathe in warm water. Avoid using hot water.  Bandage care   If you were given a bandage, change it as told by your health care provider.   Wash your hands with soap and water before and after you change your bandage. If soap and water are not available, use hand sanitizer.  General instructions   Avoid the things that caused your reaction. If you do not know what caused it, keep a journal. Write down:  ? What you eat.  ? What skin products you use.  ? What you drink.  ? What you wear in the area that has symptoms. This includes jewelry.   Check the affected areas every day for signs of infection. Check for:  ? More redness, swelling, or pain.  ? More fluid or blood.  ? Warmth.  ? Pus or a bad smell.   Keep all follow-up visits as   told by your doctor. This is important.  Contact a doctor if:   You do not get better with treatment.   Your condition gets worse.   You have signs of infection, such as:  ? More swelling.  ? Tenderness.  ? More redness.  ? Soreness.  ? Warmth.   You have a fever.   You have new symptoms.  Get help right away if:   You have a very bad headache.   You have neck pain.   Your neck is stiff.   You throw up (vomit).   You feel very sleepy.   You see red streaks coming from the area.   Your bone or joint near the area hurts after the skin has healed.   The area turns darker.   You have trouble breathing.  Summary   Dermatitis is redness, soreness, and swelling of the skin.   Symptoms may occur where the irritant has touched you.   Treatment may include medicines and skin care.   If you do not know what caused  your reaction, keep a journal.   Contact a doctor if your condition gets worse or you have signs of infection.  This information is not intended to replace advice given to you by your health care provider. Make sure you discuss any questions you have with your health care provider.  Document Released: 09/06/2009 Document Revised: 05/25/2018 Document Reviewed: 05/25/2018  Elsevier Interactive Patient Education  2019 [employer]vier Inc.

## 2018-11-29 NOTE — Progress Notes (Signed)
   Subjective:    Patient ID: Kimberly Middleton, female    DOB: 1961-11-09, 58 y.o.   MRN: [id number]  HPI  Pt is a 58 yo female who presents to the clinic with itchy rash on left lower anterior leg. Started with a few pinkish bumps and grown to cluster of red itchy papules. Does not burn or hurt. Has not seen rash anywhere else. About a week ago woke up and was there. Not put anything on it. Medications have not changed. Not changed detergents or lotions. Pt does spend a lot of time working outside. No fever, chills, body aches.  .. Active Ambulatory Problems    Diagnosis Date Noted  . Essential hypertension, benign 06/17/2016  . Hyperthyroidism 06/17/2016  . Obese 06/17/2016  . 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 Herpes zoster 07/01/2016  . Left hip pain 08/09/2018  . Osteoarthritis of left hip 08/10/2018  . DDD (degenerative disc disease), lumbar 08/10/2018  . Elevated LDL cholesterol level 08/15/2018   Resolved Ambulatory Problems    Diagnosis Date Noted  . No Resolved Ambulatory Problems   Past Medical History:  Diagnosis Date  . Bone spur 2000  . Hypertension       Review of Systems See HPI.     Objective:   Physical Exam Vitals signs reviewed.  Constitutional:      Appearance: Normal appearance.  Cardiovascular:     Rate and Rhythm: Normal rate and regular rhythm.     Pulses: Normal pulses.     Heart sounds: Normal heart sounds.  Skin:    Comments: Left anterior leg cluster of erythematous papules on a erythematous base about 3cm by 4cm. No tenderness. No discharge. No vesicles. No scales or defined borders.   Neurological:     General: No focal deficit present.     Mental Status: She is alert and oriented to person, place, and time.           Assessment & Plan:  Marland KitchenMarland KitchenHannan was seen today for rash.  Diagnoses and all orders for this visit:  Rash and nonspecific skin eruption -     triamcinolone cream (KENALOG) 0.1 %; Apply 1 application  topically 2 (two) times daily.   Unclear etiology.  Pt reports itchy only and without defined borders or scales I strongly suspect contact dermatitis. I do not see any evidence of a insect/spider bite.  Given topical steroid. Discussed side effects. Use for 2 weeks only at a time. If no improving or worsening please call office. Keep moisturized.

## 2018-12-01 ENCOUNTER — Encounter: Payer: Self-pay | Admitting: Physician Assistant

## 2019-01-11 ENCOUNTER — Encounter: Payer: Self-pay | Admitting: Physician Assistant

## 2019-01-13 MED ORDER — CLOBETASOL PROPIONATE 0.05 % EX CREA: 1.0000 "application " | TOPICAL_CREAM | Freq: Two times a day (BID) | CUTANEOUS | 0 refills | Status: AC

## 2019-01-28 IMAGING — DX DG HIP (WITH OR WITHOUT PELVIS) 2-3V*L*
3 series · 3 of 3 positions shown · non-contrast
Comparison: None.

CLINICAL DATA: Chronic progressive left hip pain.

EXAM:
DG HIP (WITH OR WITHOUT PELVIS) 2-3V LEFT

[pelvis ap]
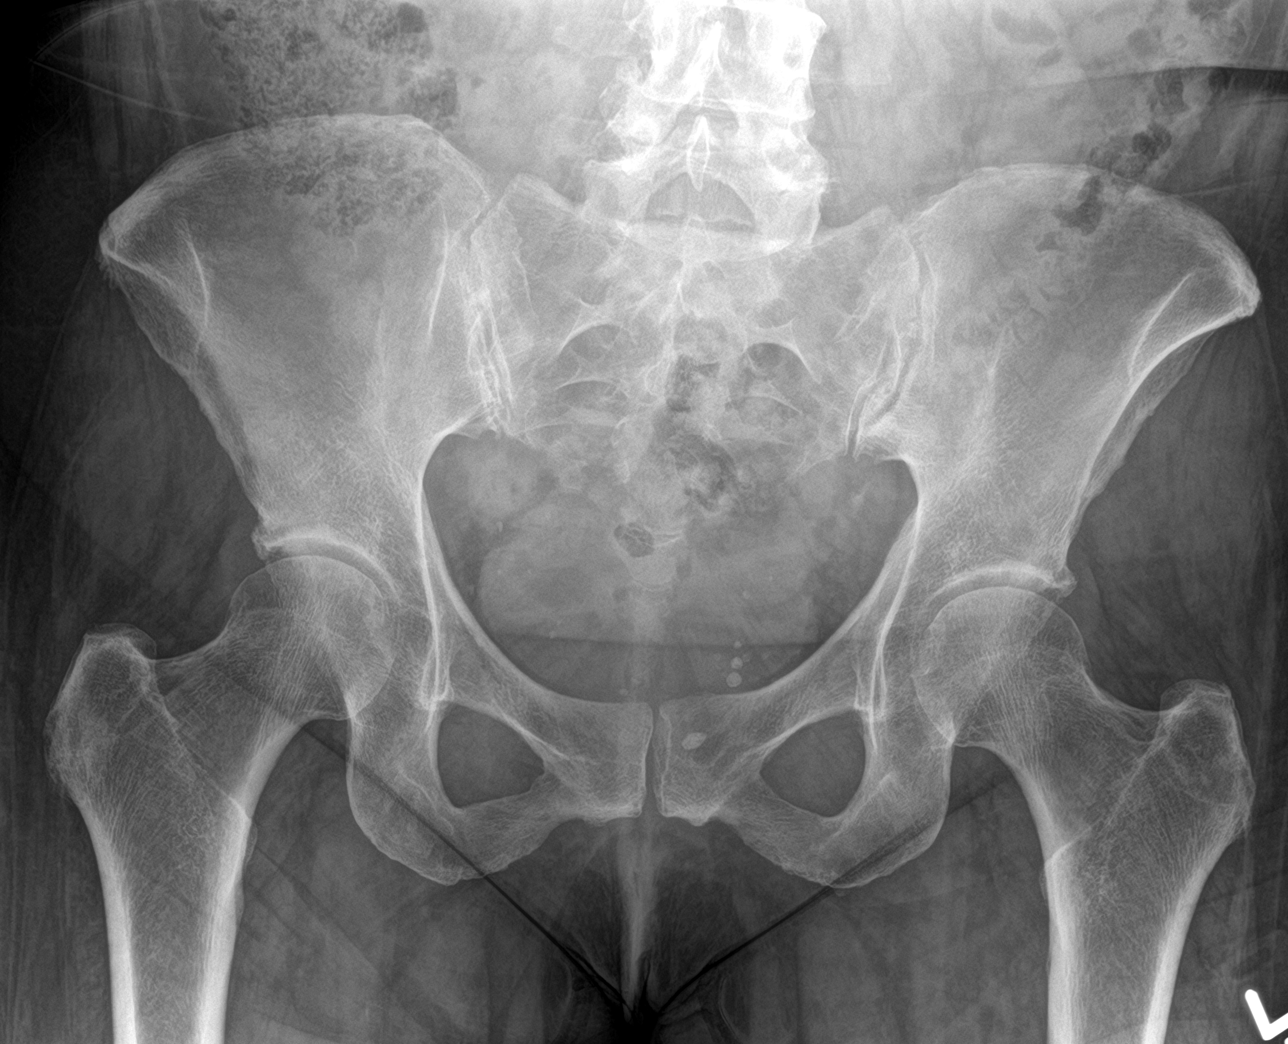

[hip ap]
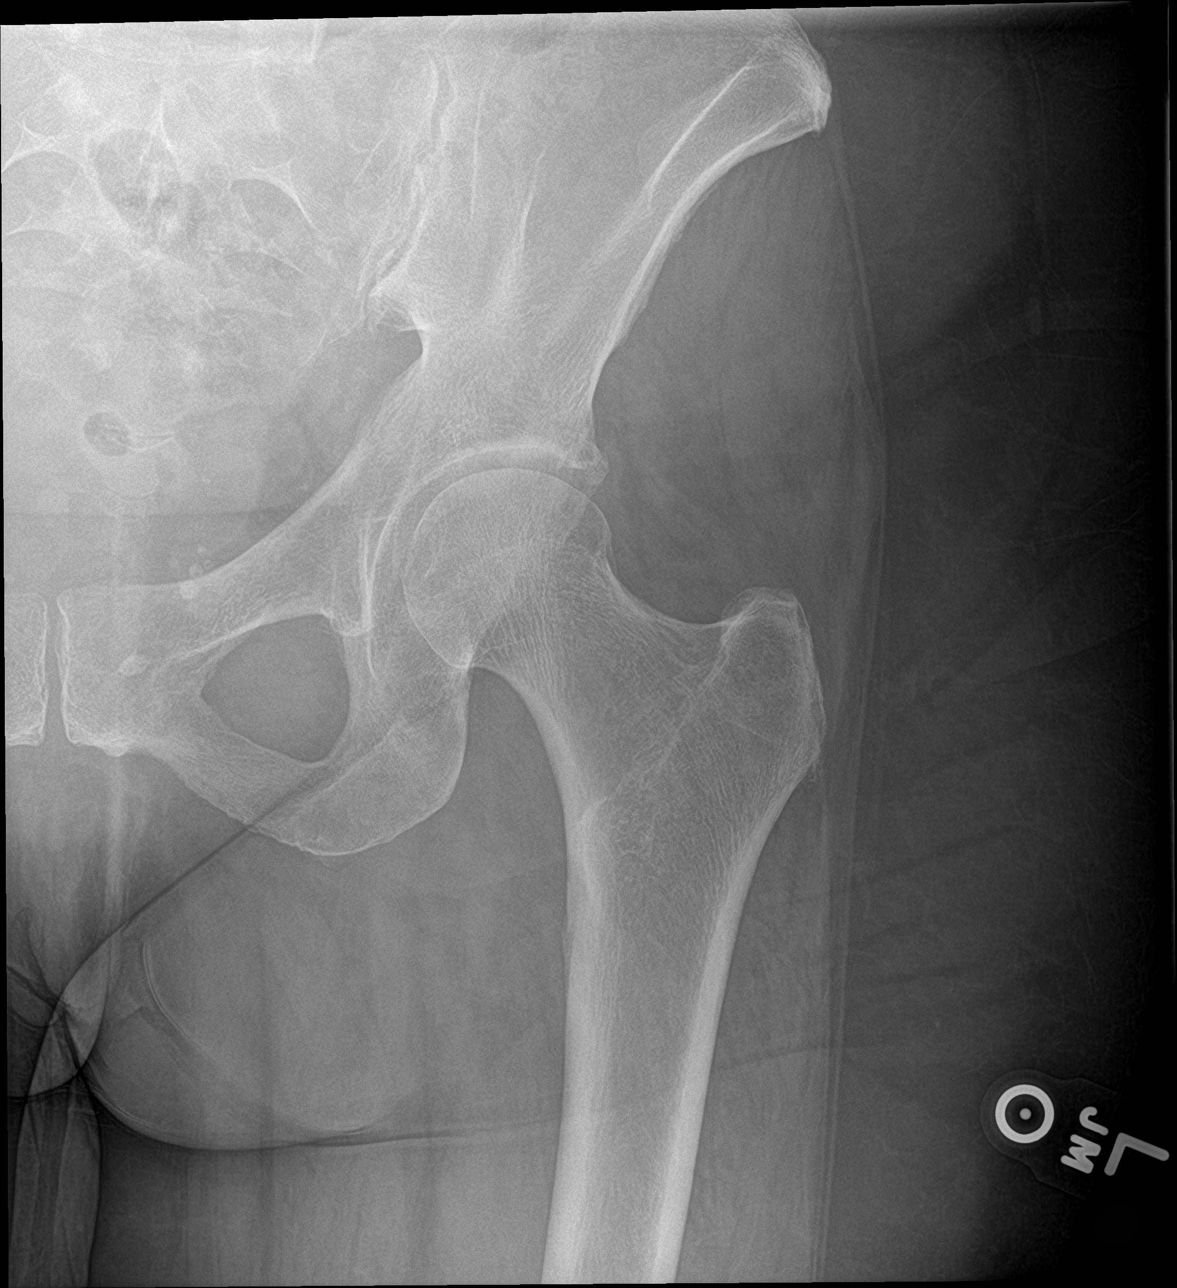

[hip frog leg]
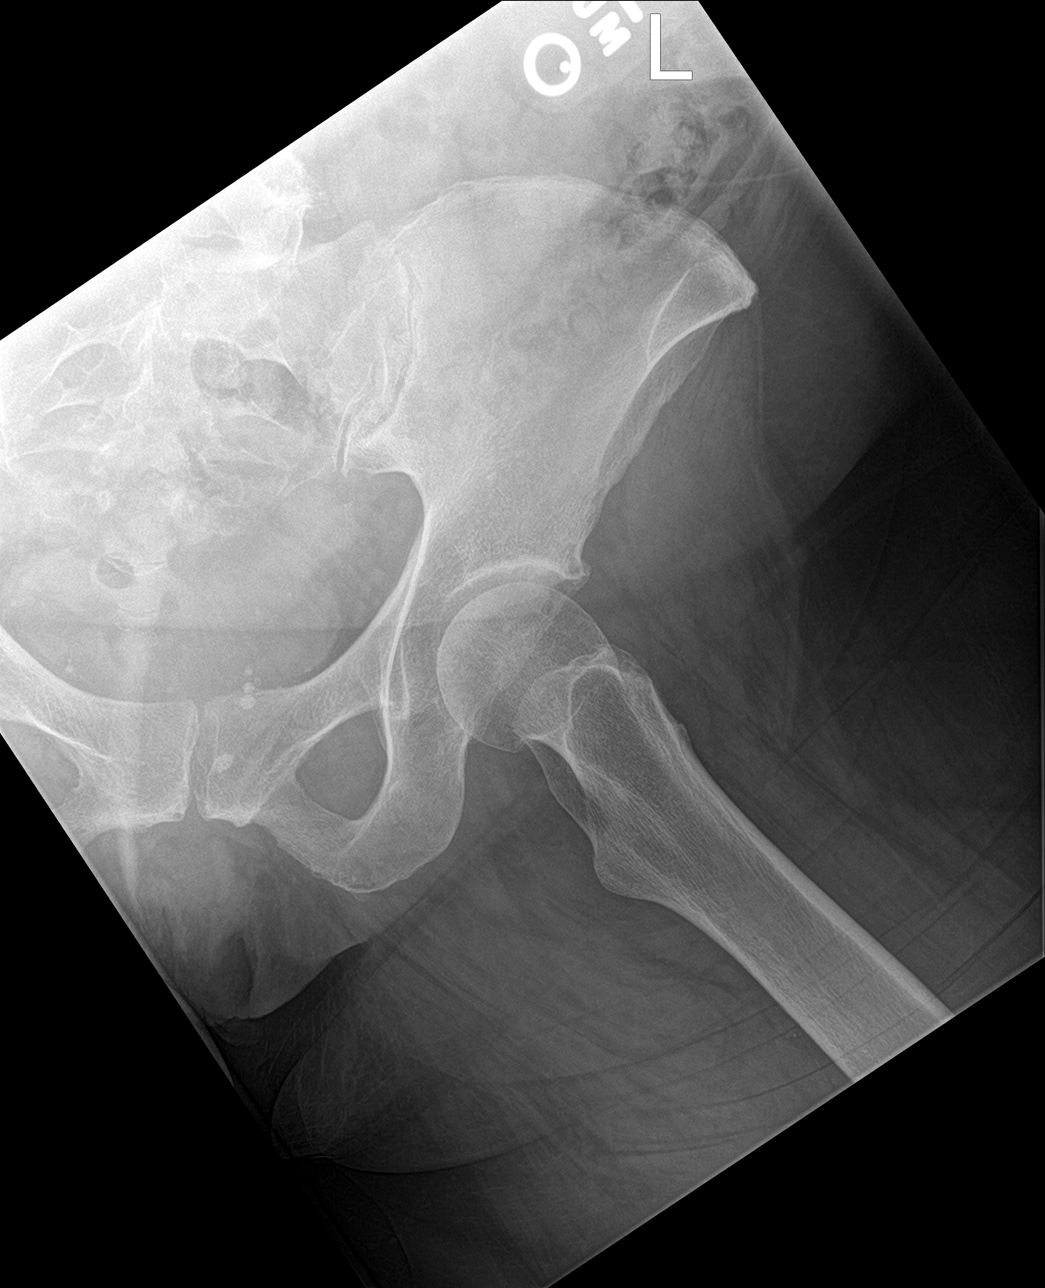

[3 of 3 positions shown; findings below may reference images not displayed]

FINDINGS: There are minimal degenerative changes of the superolateral aspect
of the left acetabulum. No joint space narrowing. Femoral head is
normal.

Pelvic bones appear otherwise normal. Degenerative facet arthritis
at L4-5, right more than left.
IMPRESSION: Minimal degenerative changes of the left hip as described.

## 2019-05-10 ENCOUNTER — Encounter: Payer: Self-pay | Admitting: Physician Assistant

## 2019-05-10 ENCOUNTER — Other Ambulatory Visit: Payer: Self-pay

## 2019-05-10 ENCOUNTER — Ambulatory Visit (INDEPENDENT_AMBULATORY_CARE_PROVIDER_SITE_OTHER): Payer: BC Managed Care – PPO | Admitting: Physician Assistant

## 2019-05-10 VITALS — BP 128/73 | HR 96 | Temp 99.0°F | Ht 70.0 in | Wt 236.0 lb

## 2019-05-10 DIAGNOSIS — R1032 Left lower quadrant pain: Secondary | ICD-10-CM | POA: Insufficient documentation

## 2019-05-10 DIAGNOSIS — R143 Flatulence: Secondary | ICD-10-CM

## 2019-05-10 DIAGNOSIS — L819 Disorder of pigmentation, unspecified: Secondary | ICD-10-CM | POA: Diagnosis not present

## 2019-05-10 NOTE — Patient Instructions (Addendum)
Probiotic to start daily.  Keep food diary.  Return hemoccult cards  If negative will get CT of abdomen and pelvis.   Diet for Irritable Bowel Syndrome When you have irritable bowel syndrome (IBS), it is very important to eat the foods and follow the eating habits that are best for your condition. IBS may cause various symptoms such as pain in the abdomen, constipation, or diarrhea. Choosing the right foods can help to ease the discomfort from these symptoms. Work with your health care provider and diet and nutrition specialist (dietitian) to find the eating plan that will help to control your symptoms. What are tips for following this plan?      Keep a food diary. This will help you identify foods that cause symptoms. Write down: ? What you eat and when you eat it. ? What symptoms you have. ? When symptoms occur in relation to your meals, such as "pain in abdomen 2 hours after dinner."  Eat your meals slowly and in a relaxed setting.  Aim to eat 5-6 small meals per day. Do not skip meals.  Drink enough fluid to keep your urine pale yellow.  Ask your health care provider if you should take an over-the-counter probiotic to help restore healthy bacteria in your gut (digestive tract). ? Probiotics are foods that contain good bacteria and yeasts.  Your dietitian may have specific dietary recommendations for you based on your symptoms. He or she may recommend that you: ? Avoid foods that cause symptoms. Talk with your dietitian about other ways to get the same nutrients that are in those problem foods. ? Avoid foods with gluten. Gluten is a protein that is found in rye, wheat, and barley. ? Eat more foods that contain soluble fiber. Examples of foods with high soluble fiber include oats, seeds, and certain fruits and vegetables. Take a fiber supplement if directed by your dietitian. ? Reduce or avoid certain foods called FODMAPs. These are foods that contain carbohydrates that are hard to  digest. Ask your doctor which foods contain these carbohydrates. What foods are not recommended? The following are some foods and drinks that may make your symptoms worse:  Fatty foods, such as french fries.  Foods that contain gluten, such as pasta and cereal.  Dairy products, such as milk, cheese, and ice cream.  Chocolate.  Alcohol.  Products with caffeine, such as coffee.  Carbonated drinks, such as soda.  Foods that are high in FODMAPs. These include certain fruits and vegetables.  Products with sweeteners such as honey, high fructose corn syrup, sorbitol, and mannitol. The items listed above may not be a complete list of foods and beverages you should avoid. Contact a dietitian for more information. What foods are good sources of fiber? Your health care provider or dietitian may recommend that you eat more foods that contain fiber. Fiber can help to reduce constipation and other IBS symptoms. Add foods with fiber to your diet a little at a time so your body can get used to them. Too much fiber at one time might cause gas and swelling of your abdomen. The following are some foods that are good sources of fiber:  Berries, such as raspberries, strawberries, and blueberries.  Tomatoes.  Carrots.  Brown rice.  Oats.  Seeds, such as chia and pumpkin seeds. The items listed above may not be a complete list of recommended sources of fiber. Contact your dietitian for more options. Where to find more information  [employer] for Functional Gastrointestinal  Disorders: www.iffgd.[employer]  National Institute of Diabetes and Digestive and Kidney Diseases: [employer].tnwww.niddk.nih.gov Summary  When you have irritable bowel syndrome (IBS), it is very important to eat the foods and follow the eating habits that are best for your condition.  IBS may cause various symptoms such as pain in the abdomen, constipation, or diarrhea.  Choosing the right foods can help to ease the discomfort  that comes from symptoms.  Keep a food diary. This will help you identify foods that cause symptoms.  Your health care provider or diet and nutrition specialist (dietitian) may recommend that you eat more foods that contain fiber. This information is not intended to replace advice given to you by your health care provider. Make sure you discuss any questions you have with your health care provider. Document Released: 01/30/2004 Document Revised: 06/06/2018 Document Reviewed: 07/13/2017 Elsevier Interactive Patient Education  2019 [employer]

## 2019-05-10 NOTE — Progress Notes (Signed)
   Subjective:    Patient ID: Kimberly Middleton, female    DOB: 09/09/1961, 58 y.o.   MRN: [id number]  HPI  Patient presents due to episodes of abdominal pain associated with fluctuance x 2 months. States she has had 4 episodes over the last 2 months. The episodes began with sharp 6/10 abdominal pain localized in the LLQ followed by frequent fluctuance for 30-36 hours. The abdominal pain does not radiate and appears at the same location each time. Patient has tried gasx with minimal relief, states gasX helps with abdominal discomfort but does not decrease the intensity or duration of the fluctuance. Denies N/V, heartburn, diarrhea, or constipation during these episodes or prior to the episode. States her bowel movements have not changed over the last 2 months. Patient thinks the episodes are associated with eating heavy meals that include fatty foods. She has tried to make changes in her diet to decrease episodes. But she is worried that these episodes could possible be something else. Wants to know If she should get her colonoscopy earlier. States she is due for a 10 year follow up with Dr. Ferdinand Lango in 1 year.   She does feel like discoloration is improving on left lower leg. Temovate cream is helping.    .. Family History  Problem Relation Age of Onset  . Heart attack Father   . Diabetes Mellitus I Brother      Review of Systems  Constitutional: Negative for activity change, appetite change, fatigue and unexpected weight change.  Gastrointestinal: Positive for abdominal pain. Negative for abdominal distention, anal bleeding, blood in stool, constipation, diarrhea, nausea, rectal pain and vomiting.  Neurological: Negative for dizziness, light-headedness and headaches.       Objective:   Physical Exam Constitutional:      Appearance: Normal appearance.  HENT:     Head: Normocephalic and atraumatic.  Cardiovascular:     Rate and Rhythm: Normal rate and regular rhythm.  Pulmonary:   Effort: Pulmonary effort is normal.     Breath sounds: Normal breath sounds.  Abdominal:     General: Bowel sounds are normal. There is no distension.     Palpations: Abdomen is soft. There is no mass.     Tenderness: There is abdominal tenderness in the left lower quadrant. There is no guarding.    Skin:    Comments: Area of hyperpigmentation of anterior left lower leg. No warmth, swelling or tenderness.            Assessment & Plan:   LLQ pain  - Instructed patient to start a daily probiotic. Also to began logging/keeping a daily food diary. Provided patient with hemoccult cards to return. Instruction on how to obtain the sample was provided. Discussed obtaining a CT abdomen and pelvis if the hemoccult cards are negative. Also discussed irritable bowel syndrome for a potential case of symptoms. Provided patient education on diet for IBS. Will continue to rule out other causes before making this the official diagnosis. Follow up as needed. No melena or hematochezia. Pt is not high risk I do not think needs early colonscopy at this point.   Flatulence  - See above.   Essential Hypertension, benign  - Blood pressure 128/73 today. Doing well on lisinopril. Continue current regimen as prescribed.   Skin hyperpigmentation -appears like could be some hemosiderin staining. No worrisome features. Follow up as needed. Continue to use topical steroid for any itching.  Follow up in 4 weeks.

## 2019-05-15 ENCOUNTER — Other Ambulatory Visit (INDEPENDENT_AMBULATORY_CARE_PROVIDER_SITE_OTHER): Payer: BC Managed Care – PPO | Admitting: *Deleted

## 2019-05-15 DIAGNOSIS — Z1211 Encounter for screening for malignant neoplasm of colon: Secondary | ICD-10-CM

## 2019-05-15 LAB — POC HEMOCCULT BLD/STL (HOME/3-CARD/SCREEN)
Card #1 Date: 6172020
Card #2 Date: 6182020
Card #2 Fecal Occult Blod, POC: NEGATIVE
Card #3 Date: 6192020
Card #3 Fecal Occult Blood, POC: NEGATIVE
Fecal Occult Blood, POC: NEGATIVE

## 2019-05-15 NOTE — Progress Notes (Signed)
GREAT news no blood detected in stool. How is left lower quadrant pain? Improving? The same?

## 2019-08-17 ENCOUNTER — Encounter: Payer: Self-pay | Admitting: Neurology

## 2019-08-17 DIAGNOSIS — K5730 Diverticulosis of large intestine without perforation or abscess without bleeding: Secondary | ICD-10-CM | POA: Insufficient documentation

## 2019-08-25 ENCOUNTER — Ambulatory Visit: Payer: BC Managed Care – PPO | Admitting: Family Medicine

## 2019-09-11 LAB — HM COLONOSCOPY

## 2019-10-02 ENCOUNTER — Encounter: Payer: Self-pay | Admitting: Physician Assistant

## 2019-11-21 LAB — HM MAMMOGRAPHY

## 2019-12-01 ENCOUNTER — Encounter: Payer: Self-pay | Admitting: Physician Assistant

## 2020-07-10 ENCOUNTER — Encounter: Payer: Self-pay | Admitting: Physician Assistant

## 2020-07-11 ENCOUNTER — Ambulatory Visit (INDEPENDENT_AMBULATORY_CARE_PROVIDER_SITE_OTHER): Payer: BC Managed Care – PPO | Admitting: Physician Assistant

## 2020-07-11 DIAGNOSIS — Z1152 Encounter for screening for COVID-19: Secondary | ICD-10-CM

## 2020-07-11 NOTE — Progress Notes (Signed)
Covid screening

## 2020-07-12 NOTE — Progress Notes (Signed)
Patient ID: Kimberly Middleton, female   DOB: 1961-05-31, 58 y.o.   MRN: [id number] Agree with plan.

## 2020-07-13 LAB — SARS-COV-2, NAA 2 DAY TAT

## 2020-07-13 LAB — NOVEL CORONAVIRUS, NAA: SARS-CoV-2, NAA: NOT DETECTED

## 2020-07-14 NOTE — Progress Notes (Signed)
Negative for covid

## 2020-12-30 LAB — HM MAMMOGRAPHY

## 2021-01-30 ENCOUNTER — Encounter: Payer: Self-pay | Admitting: Physician Assistant
